# Patient Record
Sex: Male | Born: 1980
Health system: Southern US, Community
[De-identification: ages and names within clinical notes are randomized; demographics above are authoritative.]

## PROBLEM LIST (undated history)

## (undated) DIAGNOSIS — I10 Essential (primary) hypertension: Secondary | ICD-10-CM

## (undated) DIAGNOSIS — K219 Gastro-esophageal reflux disease without esophagitis: Secondary | ICD-10-CM

---
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
## 2005-03-05 ENCOUNTER — Ambulatory Visit: Payer: Self-pay | Admitting: Internal Medicine

## 2005-04-02 ENCOUNTER — Ambulatory Visit: Payer: Self-pay | Admitting: Internal Medicine

## 2005-04-06 ENCOUNTER — Ambulatory Visit: Payer: Self-pay | Admitting: *Deleted

## 2006-08-19 ENCOUNTER — Emergency Department (HOSPITAL_COMMUNITY): Admission: EM | Admit: 2006-08-19 | Discharge: 2006-08-19 | Payer: Self-pay | Admitting: Family Medicine

## 2006-08-19 LAB — CONVERTED CEMR LAB
BUN: 4 mg/dL
Chloride: 108 meq/L
Glucose, Bld: 98 mg/dL
HCT: 44 %
Hemoglobin: 15 g/dL

## 2006-12-30 ENCOUNTER — Telehealth (INDEPENDENT_AMBULATORY_CARE_PROVIDER_SITE_OTHER): Payer: Self-pay | Admitting: *Deleted

## 2007-01-02 ENCOUNTER — Ambulatory Visit: Payer: Self-pay | Admitting: Family Medicine

## 2007-01-02 DIAGNOSIS — F17200 Nicotine dependence, unspecified, uncomplicated: Secondary | ICD-10-CM | POA: Insufficient documentation

## 2007-01-02 DIAGNOSIS — I10 Essential (primary) hypertension: Secondary | ICD-10-CM

## 2007-01-02 DIAGNOSIS — K279 Peptic ulcer, site unspecified, unspecified as acute or chronic, without hemorrhage or perforation: Secondary | ICD-10-CM | POA: Insufficient documentation

## 2007-01-10 ENCOUNTER — Telehealth (INDEPENDENT_AMBULATORY_CARE_PROVIDER_SITE_OTHER): Payer: Self-pay | Admitting: *Deleted

## 2007-01-11 ENCOUNTER — Encounter (INDEPENDENT_AMBULATORY_CARE_PROVIDER_SITE_OTHER): Payer: Self-pay | Admitting: *Deleted

## 2007-01-12 ENCOUNTER — Encounter (INDEPENDENT_AMBULATORY_CARE_PROVIDER_SITE_OTHER): Payer: Self-pay | Admitting: *Deleted

## 2007-01-12 ENCOUNTER — Ambulatory Visit: Payer: Self-pay | Admitting: Internal Medicine

## 2007-01-12 ENCOUNTER — Encounter (INDEPENDENT_AMBULATORY_CARE_PROVIDER_SITE_OTHER): Payer: Self-pay | Admitting: Nurse Practitioner

## 2007-01-16 ENCOUNTER — Encounter (INDEPENDENT_AMBULATORY_CARE_PROVIDER_SITE_OTHER): Payer: Self-pay | Admitting: Nurse Practitioner

## 2007-01-16 LAB — CONVERTED CEMR LAB
BUN: 9 mg/dL (ref 6–23)
Basophils Relative: 0 % (ref 0–1)
Chloride: 107 meq/L (ref 96–112)
Eosinophils Absolute: 0.1 10*3/uL (ref 0.0–0.7)
HCT: 40.2 % (ref 39.0–52.0)
Hemoglobin: 13.2 g/dL (ref 13.0–17.0)
Lymphocytes Relative: 40 % (ref 12–46)
Monocytes Relative: 6 % (ref 3–11)
Neutro Abs: 4.4 10*3/uL (ref 1.7–7.7)
RBC: 4.44 M/uL (ref 4.22–5.81)
Sodium: 142 meq/L (ref 135–145)

## 2007-01-17 ENCOUNTER — Telehealth (INDEPENDENT_AMBULATORY_CARE_PROVIDER_SITE_OTHER): Payer: Self-pay | Admitting: *Deleted

## 2007-01-25 ENCOUNTER — Encounter (INDEPENDENT_AMBULATORY_CARE_PROVIDER_SITE_OTHER): Payer: Self-pay | Admitting: Nurse Practitioner

## 2007-02-01 ENCOUNTER — Ambulatory Visit (HOSPITAL_COMMUNITY): Admission: RE | Admit: 2007-02-01 | Discharge: 2007-02-01 | Payer: Self-pay | Admitting: Gastroenterology

## 2007-02-01 ENCOUNTER — Encounter (INDEPENDENT_AMBULATORY_CARE_PROVIDER_SITE_OTHER): Payer: Self-pay | Admitting: Nurse Practitioner

## 2007-02-08 ENCOUNTER — Encounter (INDEPENDENT_AMBULATORY_CARE_PROVIDER_SITE_OTHER): Payer: Self-pay | Admitting: Nurse Practitioner

## 2007-05-26 ENCOUNTER — Ambulatory Visit: Payer: Self-pay | Admitting: Nurse Practitioner

## 2007-05-26 LAB — CONVERTED CEMR LAB
AST: 21 units/L (ref 0–37)
Albumin: 5 g/dL (ref 3.5–5.2)
Alkaline Phosphatase: 73 units/L (ref 39–117)
BUN: 11 mg/dL (ref 6–23)
Calcium: 9.7 mg/dL (ref 8.4–10.5)
Eosinophils Relative: 0 % (ref 0–5)
HCT: 42.8 % (ref 39.0–52.0)
Hemoglobin: 14.5 g/dL (ref 13.0–17.0)
Lymphocytes Relative: 32 % (ref 12–46)
MCHC: 33.9 g/dL (ref 30.0–36.0)
MCV: 89.4 fL (ref 78.0–100.0)
Neutro Abs: 5.5 10*3/uL (ref 1.7–7.7)
Neutrophils Relative %: 61 % (ref 43–77)
RBC: 4.79 M/uL (ref 4.22–5.81)
RDW: 13.9 % (ref 11.5–15.5)
Total Bilirubin: 0.4 mg/dL (ref 0.3–1.2)

## 2007-05-29 ENCOUNTER — Encounter (INDEPENDENT_AMBULATORY_CARE_PROVIDER_SITE_OTHER): Payer: Self-pay | Admitting: Nurse Practitioner

## 2007-08-21 ENCOUNTER — Telehealth (INDEPENDENT_AMBULATORY_CARE_PROVIDER_SITE_OTHER): Payer: Self-pay | Admitting: Nurse Practitioner

## 2007-10-02 ENCOUNTER — Ambulatory Visit: Payer: Self-pay | Admitting: Nurse Practitioner

## 2007-10-02 DIAGNOSIS — K2970 Gastritis, unspecified, without bleeding: Secondary | ICD-10-CM | POA: Insufficient documentation

## 2007-10-02 DIAGNOSIS — K2990 Gastroduodenitis, unspecified, without bleeding: Secondary | ICD-10-CM

## 2008-03-01 ENCOUNTER — Ambulatory Visit: Payer: Self-pay | Admitting: Nurse Practitioner

## 2008-03-01 DIAGNOSIS — M2140 Flat foot [pes planus] (acquired), unspecified foot: Secondary | ICD-10-CM | POA: Insufficient documentation

## 2008-07-30 ENCOUNTER — Emergency Department (HOSPITAL_COMMUNITY): Admission: EM | Admit: 2008-07-30 | Discharge: 2008-07-30 | Payer: Self-pay | Admitting: Emergency Medicine

## 2009-04-29 ENCOUNTER — Ambulatory Visit: Payer: Self-pay | Admitting: Nurse Practitioner

## 2009-04-29 LAB — CONVERTED CEMR LAB: Rapid HIV Screen: NEGATIVE

## 2009-05-01 ENCOUNTER — Encounter (INDEPENDENT_AMBULATORY_CARE_PROVIDER_SITE_OTHER): Payer: Self-pay | Admitting: Nurse Practitioner

## 2009-05-01 DIAGNOSIS — E7800 Pure hypercholesterolemia, unspecified: Secondary | ICD-10-CM

## 2009-05-01 LAB — CONVERTED CEMR LAB
Albumin: 4.9 g/dL (ref 3.5–5.2)
Basophils Absolute: 0 10*3/uL (ref 0.0–0.1)
Basophils Relative: 0 % (ref 0–1)
CO2: 24 meq/L (ref 19–32)
Calcium: 9.4 mg/dL (ref 8.4–10.5)
Cholesterol: 215 mg/dL — ABNORMAL HIGH (ref 0–200)
Eosinophils Absolute: 0 10*3/uL (ref 0.0–0.7)
Lymphs Abs: 2.9 10*3/uL (ref 0.7–4.0)
MCHC: 33 g/dL (ref 30.0–36.0)
Potassium: 4.2 meq/L (ref 3.5–5.3)
Sodium: 139 meq/L (ref 135–145)
TSH: 1.027 microintl units/mL (ref 0.350–4.500)
Total Bilirubin: 0.3 mg/dL (ref 0.3–1.2)
WBC: 9.9 10*3/uL (ref 4.0–10.5)

## 2009-09-01 ENCOUNTER — Emergency Department (HOSPITAL_COMMUNITY): Admission: EM | Admit: 2009-09-01 | Discharge: 2009-09-01 | Payer: Self-pay | Admitting: Emergency Medicine

## 2009-10-13 ENCOUNTER — Emergency Department (HOSPITAL_COMMUNITY): Admission: EM | Admit: 2009-10-13 | Discharge: 2009-10-13 | Payer: Self-pay | Admitting: Emergency Medicine

## 2009-10-17 ENCOUNTER — Telehealth (INDEPENDENT_AMBULATORY_CARE_PROVIDER_SITE_OTHER): Payer: Self-pay | Admitting: Nurse Practitioner

## 2010-03-18 ENCOUNTER — Telehealth (INDEPENDENT_AMBULATORY_CARE_PROVIDER_SITE_OTHER): Payer: Self-pay | Admitting: Nurse Practitioner

## 2010-04-24 ENCOUNTER — Ambulatory Visit: Payer: Self-pay | Admitting: Rheumatology

## 2010-05-26 NOTE — Progress Notes (Signed)
Summary: Pt injured his shoulder   Phone Note Call from Patient Call back at [phone]   Summary of Call: This past Sunday,  the pt above was wrestling, and he hurt his shoulder and as result, on June 20th   he went to the ED and they gave him ibuprofen and vicodin. The two medications cause him stomach problems.  Also he needs inflammatory medication because his shoulder is inflames badly. The ED did an x-ray on him and there is not a fracture but the pt is under a lot of pain. [facility] FNP   Initial call taken by: Manon Hilding,  October 17, 2009 12:13 PM  Follow-up for Phone Call        E-charts report is on your desk for review. Follow-up by: Levon Hedger,  October 17, 2009 12:38 PM  Additional Follow-up for Phone Call Additional follow up Details #1::        Shoulder strain according to notes from ED.  Xray neg. He can increase his nexium to two times a day. He should take ibuprofen with food. He can take Aleve instead of ibuprofen (2 tab two times a day) with a meal as needed for pain. He can take tylenol 500 mg 1-2 tabs every 6 hours as needed for pain. Do not take Tylenol with the Norco as it has tylenol in it. Schedule appt if no better. Additional Follow-up by: Tereso Newcomer PA-C,  October 17, 2009 2:46 PM    Additional Follow-up for Phone Call Additional follow up Details #2::    pt informed. Follow-up by: Levon Hedger,  October 17, 2009 4:10 PM   Appended Document: Pt injured his shoulder reviewed ER note and agree with plan as ordered by Wende Mott

## 2010-05-26 NOTE — Progress Notes (Signed)
Summary: NEED TO SPEAK WITH THE NURSE  Phone Note Call from Patient   Summary of Call: PLEASE CALL PATIENT THE ASK FOR APP FOR TODAY HE IS HAVING STOMACH PAIN PHONE [phone] Initial call taken by: Domenic Polite,  March 18, 2010 10:34 AM  Follow-up for Phone Call        complains of constant pain lower Abd, occasionally sharp other times it's burning sensation. Has been taking Carafate and Nexium as ordered. Last 2 mornings has vomited. Has appt. 04/24/10 no earlier appts. available. Requesting to be seen or ordered some other medication. Follow-up by: Gaylyn Cheers RN,  March 18, 2010 12:38 PM  Additional Follow-up for Phone Call Additional follow up Details #1::        Pt has had this in the past - seems to reoccur around holiday time. reinforce that pt needs to avoid alcohol use. he should eat small meals. avoid fried spicy foods, also tomato based foods He should increase nexium to two times a day x 1 weeks then resume daily before breakfast. carafate should be three times a day before meals. No refills in several months - what pharmacy does pt use so i can send refills there. doubt he has been compliant with meds given lack of refills during year Additional Follow-up by: Lehman Prom FNP,  March 18, 2010 12:49 PM    Additional Follow-up for Phone Call Additional follow up Details #2::    Pt. given info. Gets meds @ [facility] however he said he just got his card renewed. Checked with [facility] he has all his meds waiting for pick-up. Called pt. again to let him know he needs to pick up meds. Follow-up by: Gaylyn Cheers RN,  March 18, 2010 1:39 PM  Additional Follow-up for Phone Call Additional follow up Details #3:: Details for Additional Follow-up Action Taken: Thanks Additional Follow-up by: Lehman Prom FNP,  March 18, 2010 3:13 PM

## 2010-05-26 NOTE — Assessment & Plan Note (Signed)
Summary: Gastritis/HTN   Vital Signs:  Patient profile:   30 year old male Weight:      221.5 pounds BMI:     31.67 BSA:     2.19 Temp:     97.8 degrees F oral Pulse rate:   90 / minute Pulse rhythm:   regular Resp:     20 per minute BP sitting:   132 / 80  (left arm) Cuff size:   large  Vitals Entered By: Levon Hedger (April 29, 2009 12:49 PM) CC: stomach issue since last wednesday having some burning, Hypertension Management Is Patient Diabetic? No Pain Assessment Patient in pain? yes     Location: stomach Intensity: 7  Does patient need assistance? Functional Status Self care Ambulation Normal   CC:  stomach issue since last wednesday having some burning and Hypertension Management.  History of Present Illness:  Pt into the office for follow up for stomach problems Pt was doing well until last Wednesday. Vomiting x 1 with coffee ground emesis admits that over the holidays he drank some ETOH and ate some fried foods both of which he had taken out of his diet about 1 year ago.  Girlfriend presents today with pt  Hypertension History:      He denies headache, chest pain, and palpitations.  He notes no problems with any antihypertensive medication side effects.        Positive major cardiovascular risk factors include hypertension and current tobacco user.  Negative major cardiovascular risk factors include male age less than 50 years old, no history of diabetes, and negative family history for ischemic heart disease.        Further assessment for target organ damage reveals no history of ASHD, cardiac end-organ damage (CHF/LVH), stroke/TIA, peripheral vascular disease, renal insufficiency, or hypertensive retinopathy.     Allergies (verified): No Known Drug Allergies  Review of Systems General:  Denies fever. CV:  Denies chest pain or discomfort. Resp:  Denies cough. GI:  Complains of abdominal pain, nausea, and vomiting.  Physical Exam  General:   alert.   Head:  normocephalic.   Lungs:  normal breath sounds.   Heart:  normal rate and regular rhythm.   Abdomen:  mid epigastric pain Neurologic:  alert & oriented X3.     Impression & Recommendations:  Problem # 1:  GASTRITIS (ICD-535.50) restart bland diet and meds advise pt if he sees any bright red blood with emesis this should be considered an emergency His updated medication list for this problem includes:    Carafate 1 Gm Tabs (Sucralfate) .Marland Kitchen... 1 tablet by mouth three times a day for stomach    Nexium 40 Mg Cpdr (Esomeprazole magnesium) .Marland Kitchen... 1 tablet by mouth two times a day for 1 week then one tablet by mouth daily  for stomach  Problem # 2:  HYPERTENSION, BENIGN ESSENTIAL (ICD-401.1) stable DASH diet His updated medication list for this problem includes:    Norvasc 5 Mg Tabs (Amlodipine besylate) .Marland Kitchen... 1 tablet by mouth daily for blood pressure    Diltiazem Hcl Cr 240 Mg Cp24 (Diltiazem hcl) .Marland Kitchen... 1 tablet by mouth daily for blood pressure  Orders: T-Lipid Profile ([id number]) T-Comprehensive Metabolic Panel [phone]) T-CBC w/Diff (52841-32440) Rapid HIV  (10272) T-TSH (53664-40347)  Problem # 3:  TOBACCO ABUSE (ICD-305.1)  Problem # 4:  PES PLANUS (ICD-734)  Complete Medication List: 1)  Carafate 1 Gm Tabs (Sucralfate) .Marland Kitchen.. 1 tablet by mouth three times a day for stomach 2)  Norvasc 5 Mg Tabs (Amlodipine besylate) .Marland Kitchen.. 1 tablet by mouth daily for blood pressure 3)  Diltiazem Hcl Cr 240 Mg Cp24 (Diltiazem hcl) .Marland Kitchen.. 1 tablet by mouth daily for blood pressure 4)  Nexium 40 Mg Cpdr (Esomeprazole magnesium) .Marland Kitchen.. 1 tablet by mouth two times a day for 1 week then one tablet by mouth daily  for stomach  Hypertension Assessment/Plan:      The patient's hypertensive risk group is category B: At least one risk factor (excluding diabetes) with no target organ damage.  Today's blood pressure is 132/80.  His blood pressure goal is < 140/90.  Patient Instructions: 1)   You will need to avoid fried, spicy foods. 2)  Your medications will sent to the pharmacy. 3)  Follow up as needed. Prescriptions: NEXIUM 40 MG CPDR (ESOMEPRAZOLE MAGNESIUM) 1 tablet by mouth two times a day for 1 week then one tablet by mouth daily  for stomach  #42 x 5   Entered and Authorized by:   Lehman Prom FNP   Signed by:   Lehman Prom FNP on 04/29/2009   Method used:   Faxed to ...       [facility] (retail)       [address] [place], [place]  [address]       Ph: [phone]       Fax: [phone]   RxID:   [id number] DILTIAZEM HCL CR 240 MG  CP24 (DILTIAZEM HCL) 1 tablet by mouth daily for blood pressure  #30 x 5   Entered and Authorized by:   Lehman Prom FNP   Signed by:   Lehman Prom FNP on 04/29/2009   Method used:   Faxed to ...       [facility] - [facility] (retail)       [address] [place], [place]  [address]       Ph: [phone]       Fax: [phone]   RxID:   [id number] NORVASC 5 MG  TABS (AMLODIPINE BESYLATE) 1 tablet by mouth daily for blood pressure  #30 x 5   Entered and Authorized by:   Lehman Prom FNP   Signed by:   Lehman Prom FNP on 04/29/2009   Method used:   Faxed to ...       [facility] - [facility] (retail)       [address] [place], [place]  [address]       Ph: [phone]       Fax: [phone]   RxID:   [id number] CARAFATE 1 GM  TABS (SUCRALFATE) 1 tablet by mouth three times a day for stomach  #90 x 5   Entered and Authorized by:   Lehman Prom FNP   Signed by:   Lehman Prom FNP on 04/29/2009   Method used:   Faxed to ...       [facility] - [facility] (retail)       [address] [place], [place]  [address]       Ph: [phone]       Fax: [phone]   RxID:   [id number]   Laboratory Results  Date/Time Received: April 29, 2009 1:59 PM  Date/Time Reported: April 29, 2009 1:59 PM   Other Tests  Rapid HIV: negative

## 2010-05-26 NOTE — Letter (Signed)
Summary: Lipid Letter  [facility]  [address] [place], [place] [address]   Phone: [phone]  Fax: [phone]    05/01/2009  Johnathan Roy [address] [place], [place]  [address]  Dear Johnathan Roy:  We have carefully reviewed your last lipid profile from 04/29/2009 and the results are noted below with a summary of recommendations for lipid management.    Cholesterol:       215     Goal: less than 200   HDL "good" Cholesterol:   69     Goal: greater than 40   LDL "bad" Cholesterol:   125     Goal: less than 130   Triglycerides:       103     Goal: less than 150    Your cholesterol was a little high during your last visit.  There is no need for medications, just try to continue to avoid fried fatty foods.     Current Medications: 1)    Carafate 1 Gm  Tabs (Sucralfate) .Marland Kitchen.. 1 tablet by mouth three times a day for stomach 2)    Norvasc 5 Mg  Tabs (Amlodipine besylate) .Marland Kitchen.. 1 tablet by mouth daily for blood pressure 3)    Diltiazem Hcl Cr 240 Mg  Cp24 (Diltiazem hcl) .Marland Kitchen.. 1 tablet by mouth daily for blood pressure 4)    Nexium 40 Mg Cpdr (Esomeprazole magnesium) .Marland Kitchen.. 1 tablet by mouth two times a day for 1 week then one tablet by mouth daily  for stomach  If you have any questions, please call. We appreciate being able to work with you.   Sincerely,    [facility] Lehman Prom FNP

## 2010-08-05 LAB — COMPREHENSIVE METABOLIC PANEL
Albumin: 4.1 g/dL (ref 3.5–5.2)
Alkaline Phosphatase: 64 U/L (ref 39–117)
BUN: 14 mg/dL (ref 6–23)
Creatinine, Ser: 0.94 mg/dL (ref 0.4–1.5)
GFR calc non Af Amer: >60 mL/min (ref >60)
Potassium: 3.7 mEq/L (ref 3.5–5.1)
Total Bilirubin: 0.7 mg/dL (ref 0.3–1.2)
Total Protein: 7 g/dL (ref 6.0–8.3)

## 2010-08-05 LAB — URINALYSIS, ROUTINE W REFLEX MICROSCOPIC
Bilirubin Urine: NEGATIVE
Leukocytes, UA: NEGATIVE
Nitrite: NEGATIVE
Protein, ur: NEGATIVE mg/dL
Urobilinogen, UA: 1 mg/dL (ref 0.0–1.0)

## 2010-08-05 LAB — DIFFERENTIAL
Eosinophils Absolute: 0.1 10*3/uL (ref 0.0–0.7)
Eosinophils Relative: 1 % (ref 0–5)
Lymphs Abs: 4.2 10*3/uL — ABNORMAL HIGH (ref 0.7–4.0)

## 2010-08-05 LAB — CBC
Hemoglobin: 13.9 g/dL (ref 13.0–17.0)
RBC: 4.43 MIL/uL (ref 4.22–5.81)
RDW: 14.4 % (ref 11.5–15.5)
WBC: 11.1 10*3/uL — ABNORMAL HIGH (ref 4.0–10.5)

## 2010-08-05 LAB — LIPASE, BLOOD: Lipase: 21 U/L (ref 11–59)

## 2010-09-08 NOTE — Op Note (Signed)
NAMEAUDEL, COAKLEY             ACCOUNT NO.:  [id number]   MEDICAL RECORD NO.:  [id number]          PATIENT TYPE:  AMB   LOCATION:  ENDO                         FACILITY:  [facility]   PHYSICIAN:  Graylin Shiver, M.D.   DATE OF BIRTH:  [birth year]   DATE OF PROCEDURE:  02/01/2007  DATE OF DISCHARGE:                               OPERATIVE REPORT   PROCEDURE:  Esophagogastroduodenoscopy.   INDICATIONS FOR PROCEDURE:  The patient is a 30 year old male who  complains of epigastric pain, vomiting and hematemesis.   Informed consent was obtained after explanation of the risks of  bleeding, infection and perforation.   PREMEDICATION:  Fentanyl 75 mcg IV, Versed 5 mg IV.   PROCEDURE:  With the patient in the left lateral decubitus position, the  Pentax gastroscope was inserted into the oropharynx and passed into the  esophagus.  It was advanced down the esophagus then into the stomach and  into the duodenum.  The second portion and bulb of the duodenum looked  normal.  The stomach looked normal in its entirety.  The esophagus  looked normal in its entirety.  He tolerated the procedure well without  complications.   IMPRESSION:  Normal esophagogastroduodenoscopy.   There is nothing on this exam to explain epigastric pain, vomiting or  hematemesis           ______________________________  Graylin Shiver, M.D.     SFG/MEDQ  D:  02/01/2007  T:  02/02/2007  Job:  [id number]   cc:   Benetta Spar R. Rankins, M.D.  Fax: [phone]

## 2011-12-11 ENCOUNTER — Emergency Department (HOSPITAL_COMMUNITY)
Admission: EM | Admit: 2011-12-11 | Discharge: 2011-12-11 | Disposition: A | Discharge status: Home / Self Care | Payer: No Typology Code available for payment source | Attending: Emergency Medicine | Admitting: Emergency Medicine

## 2011-12-11 ENCOUNTER — Encounter (HOSPITAL_COMMUNITY): Payer: Self-pay | Admitting: Emergency Medicine

## 2011-12-11 DIAGNOSIS — M546 Pain in thoracic spine: Secondary | ICD-10-CM | POA: Insufficient documentation

## 2011-12-11 DIAGNOSIS — M549 Dorsalgia, unspecified: Secondary | ICD-10-CM

## 2011-12-11 DIAGNOSIS — I10 Essential (primary) hypertension: Secondary | ICD-10-CM | POA: Insufficient documentation

## 2011-12-11 DIAGNOSIS — Y92410 Unspecified street and highway as the place of occurrence of the external cause: Secondary | ICD-10-CM | POA: Insufficient documentation

## 2011-12-11 HISTORY — DX: Essential (primary) hypertension: I10

## 2011-12-11 MED ORDER — HYDROCODONE-ACETAMINOPHEN 5-325 MG PO TABS
1.0000 | ORAL_TABLET | Freq: Once | ORAL | Status: AC
  Administered 2011-12-11: 1 via ORAL
  Filled 2011-12-11: qty 1

## 2011-12-11 MED ORDER — CYCLOBENZAPRINE HCL 10 MG PO TABS: 10.0000 mg | ORAL_TABLET | Freq: Three times a day (TID) | ORAL | Status: AC | PRN

## 2011-12-11 MED ORDER — DIAZEPAM 5 MG PO TABS
5.0000 mg | ORAL_TABLET | Freq: Once | ORAL | Status: AC
  Administered 2011-12-11: 5 mg via ORAL
  Filled 2011-12-11: qty 1

## 2011-12-11 MED ORDER — HYDROCODONE-ACETAMINOPHEN 5-325 MG PO TABS: 1.0000 | ORAL_TABLET | Freq: Four times a day (QID) | ORAL | Status: AC | PRN
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
## 2011-12-11 NOTE — ED Provider Notes (Signed)
Medical screening examination/treatment/procedure(s) were performed by non-physician practitioner and as supervising physician I was immediately available for consultation/collaboration.   Gavin Pound. Darbie Biancardi, MD 12/11/11 2310
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
## 2011-12-20 ENCOUNTER — Encounter: Payer: Self-pay | Admitting: Family Medicine

## 2011-12-20 ENCOUNTER — Ambulatory Visit (INDEPENDENT_AMBULATORY_CARE_PROVIDER_SITE_OTHER): Payer: Self-pay | Admitting: Family Medicine

## 2011-12-20 VITALS — BP 123/84 | HR 87 | Temp 98.4°F | Ht 71.0 in | Wt 227.0 lb

## 2011-12-20 DIAGNOSIS — F17200 Nicotine dependence, unspecified, uncomplicated: Secondary | ICD-10-CM

## 2011-12-20 DIAGNOSIS — K2990 Gastroduodenitis, unspecified, without bleeding: Secondary | ICD-10-CM

## 2011-12-20 DIAGNOSIS — M549 Dorsalgia, unspecified: Secondary | ICD-10-CM

## 2011-12-20 DIAGNOSIS — I10 Essential (primary) hypertension: Secondary | ICD-10-CM

## 2011-12-20 DIAGNOSIS — K2970 Gastritis, unspecified, without bleeding: Secondary | ICD-10-CM

## 2011-12-20 MED ORDER — CHLORTHALIDONE 25 MG PO TABS: 25.0000 mg | ORAL_TABLET | Freq: Every day | ORAL | Status: DC

## 2011-12-20 MED ORDER — ESOMEPRAZOLE MAGNESIUM 40 MG PO CPDR: 40.0000 mg | DELAYED_RELEASE_CAPSULE | Freq: Every day | ORAL | Status: DC

## 2011-12-20 NOTE — Patient Instructions (Addendum)
The new blood pressure medicine is called Chlorthalidone.   Take this once a day.    Refill for the Nexium as well.    Come back in 2 weeks for a nurse visit to recheck your blood pressure.

## 2011-12-22 DIAGNOSIS — M549 Dorsalgia, unspecified: Secondary | ICD-10-CM | POA: Insufficient documentation
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
## 2011-12-22 NOTE — Assessment & Plan Note (Signed)
No red flags based on history or physical today Refill for Nexium.
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
## 2011-12-22 NOTE — Assessment & Plan Note (Signed)
At goal today. He was previously taking amlodipine and diltiazem provided by previous PCP. Switch to chlorthalidone today. He may actually do well off of blood pressure medications as he has been out of his blood pressure medication for about 3 months and is still normotensive today. Follow up in 2 weeks for blood pressure recheck.

## 2011-12-22 NOTE — Assessment & Plan Note (Signed)
Counseled to quit completely 

## 2012-10-26 ENCOUNTER — Encounter: Payer: Self-pay | Admitting: Sports Medicine

## 2012-10-26 ENCOUNTER — Ambulatory Visit (INDEPENDENT_AMBULATORY_CARE_PROVIDER_SITE_OTHER): Payer: Self-pay | Admitting: Sports Medicine

## 2012-10-26 VITALS — BP 125/70 | HR 95 | Temp 98.7°F | Ht 71.0 in | Wt 218.0 lb

## 2012-10-26 DIAGNOSIS — K2990 Gastroduodenitis, unspecified, without bleeding: Secondary | ICD-10-CM

## 2012-10-26 DIAGNOSIS — K2970 Gastritis, unspecified, without bleeding: Secondary | ICD-10-CM

## 2012-10-26 MED ORDER — ESOMEPRAZOLE MAGNESIUM 40 MG PO CPDR: 40.0000 mg | DELAYED_RELEASE_CAPSULE | Freq: Every day | ORAL | Status: DC

## 2012-10-26 MED ORDER — ONDANSETRON HCL 4 MG PO TABS: 4.0000 mg | ORAL_TABLET | Freq: Three times a day (TID) | ORAL | Status: DC | PRN

## 2012-10-26 NOTE — Patient Instructions (Addendum)
It was nice to see you today.   Today we discussed: 1. GASTRITIS Try - esomeprazole (NEXIUM) 40 MG capsule; Take 1 capsule (40 mg total) by mouth daily.  Dispense: 30 capsule; Refill: 3 - ondansetron (ZOFRAN) 4 MG tablet; Take 1 tablet (4 mg total) by mouth every 8 (eight) hours as needed for nausea.  Dispense: 20 tablet; Refill: 0   Please plan to return to see Dr. Gwendolyn Grant in 3 months.  If you need anything prior to seeing me please call the clinic.  Please Bring all medications with you to each appointment.  STOP SMOKING  Cyclic Vomiting Syndrome Cyclic vomiting syndrome is a benign condition in which patients experience bouts or cycles of severe nausea and vomiting that last for hours or even days. The bouts of nausea and vomiting alternate with longer periods of no symptoms and generally good health. Cyclic vomiting syndrome occurs mostly in children, but can affect adults. CAUSES  [facility] has no known cause. Each episode is typically similar to the previous ones. The episodes tend to:   Start at about the same time of day.  Last the same length of time.  Present the same symptoms at the same level of intensity. Cyclic vomiting syndrome can begin at any age in children and adults. Cyclic vomiting syndrome usually starts between the ages of 3 and 7 years. In adults, episodes tend to occur less often than they do in children, but they last longer. Furthermore, the events or situations that trigger episodes in adults cannot always be pinpointed as easily as they can in children. There are 4 phases of cyclic vomiting syndrome: 1. Prodrome. The prodrome phase signals that an episode of nausea and vomiting is about to begin. This phase can last from just a few minutes to several hours. This phase is often marked by belly (abdominal) pain. Sometimes taking medicine early in the prodrome phase can stop an episode in progress. However, sometimes there is no warning. A person may simply wake up in  the middle of the night or early morning and begin vomiting. 2. Episode. The episode phase consists of:  Severe vomiting.  Nausea.  Gagging (retching). 3. Recovery. The recovery phase begins when the nausea and vomiting stop. Healthy color, appetite, and energy return. 4. Symptom-free interval. The symptom-free interval phase is the period between episodes when no symptoms are present. TRIGGERS Episodes can be triggered by an infection or event. Examples of triggers include:  Infections.  Colds, allergies, sinus problems, and the flu.  Eating certain foods such as chocolate or cheese.  Foods with monosodium glutamate (MSG) or preservatives.  Fast foods.  Pre-packaged foods.  Foods with low nutritional value (junk foods).  Overeating.  Eating just before going to bed.  Hot weather.  Dehydration.  Not enough sleep or poor sleep quality.  Physical exhaustion.  Menstruation.  Motion sickness.  Emotional stress (school or home difficulties).  Excitement or stress. SYMPTOMS  The main symptoms of cyclic vomiting syndrome are:  Severe vomiting.  Nausea.  Gagging (retching). Episodes usually begin at night or the first thing in the morning. Episodes may include vomiting or retching up to 5 or 6 times an hour during the worst of the episode. Episodes usually last anywhere from 1 to 4 days. Episodes can last for up to 10 days. Other symptoms include:  Paleness.  Exhaustion.  Listlessness.  Abdominal pain.  Loose stools or diarrhea. Sometimes the nausea and vomiting are so severe that a person appears to  be almost unconscious. Sensitivity to light, headache, fever, dizziness, may also accompany an episode. In addition, the vomiting may cause drooling and excessive thirst. Drinking water usually leads to more vomiting, though the water can dilute the acid in the vomit, making the episode a little less painful. Continuous vomiting can lead to dehydration, which  means that the body has lost excessive water and salts. DIAGNOSIS  Cyclic vomiting syndrome is hard to diagnose because there are no clear tests to identify it. A caregiver must diagnose cyclic vomiting syndrome by looking at symptoms and medical history. A caregiver must exclude more common diseases or disorders that can also cause nausea and vomiting. Also, diagnosis takes time because caregivers need to identify a pattern or cycle to the vomiting. TREATMENT  Cyclic vomiting syndrome cannot be cured. Treatment varies, but people with cyclic vomiting syndrome should get plenty of rest and sleep and take medications that prevent, stop, or lessen the vomiting episodes and other symptoms. People whose episodes are frequent and long-lasting may be treated during the symptom-free intervals in an effort to prevent or ease future episodes. The symptom-free phase is a good time to eliminate anything known to trigger an episode. For example, if episodes are brought on by stress or excitement, this period is the time to find ways to reduce stress and stay calm. If sinus problems or allergies cause episodes, those conditions should be treated. The triggers listed above should be avoided or prevented. Because of the similarities between migraine and cyclic vomiting syndrome, caregivers treat some people with severe cyclic vomiting syndrome with drugs that are also used for migraine headaches. The drugs are designed to:  Prevent episodes.  Reduce their frequency.  Lessen their severity. HOME CARE INSTRUCTIONS Once a vomiting episode begins, treatment is supportive. It helps to stay in bed and sleep in a dark, quiet room. Severe nausea and vomiting may require hospitalization and intravenous (IV) fluids to prevent dehydration. Relaxing medications (sedatives) may help if the nausea continues. Sometimes, during the prodrome phase, it is possible to stop an episode from happening altogether. Only take  over-the-counter or prescription medicines for pain, discomfort or fever as directed by your caregiver. Do not give aspirin to children. During the recovery phase, drinking water and replacing lost electrolytes (salts in the blood) are very important. Electrolytes are salts that the body needs to function well and stay healthy. Symptoms during the recovery phase can vary. Some people find that their appetites return to normal immediately, while others need to begin by drinking clear liquids and then move slowly to solid food. RELATED COMPLICATIONS The severe vomiting that defines cyclic vomiting syndrome is a risk factor for several complications:  Dehydration Vomiting causes the body to lose water quickly.  Electrolyte imbalance Vomiting also causes the body to lose the important salts it needs to keep working properly.  Peptic esophagitis The tube that connects the mouth to the stomach (esophagus) becomes injured from the stomach acid that comes up with the vomit.  Hematemesis The esophagus becomes irritated and bleeds, so blood mixes with the vomit.  Mallory-Weiss tear The lower end of the esophagus may tear open or the stomach may bruise from vomiting or retching.  Tooth decay The acid in the vomit can hurt the teeth by corroding the tooth enamel. SEEK MEDICAL CARE IF: You have questions or problems. Document Released: 06/21/2001 Document Revised: 07/05/2011 Document Reviewed: 07/20/2010 [facility] Patient Information 2014 [place], [place].

## 2012-10-26 NOTE — Assessment & Plan Note (Addendum)
Some blood streaked emesis Chronic cyclic in nature.  Worsened since off Nexium Refill Nexium, Rx for Zofran Consider Cyclic Vomiting - associated with THC -precautions for ED if needed > f/u with Dr. Gwendolyn Grant in 3 months

## 2012-10-26 NOTE — Progress Notes (Signed)
  [facility]  Patient name: Denman Pichardo MRN [id number]  Date of birth: 13-Aug-1980  CC & HPI:  Manas Hickling is a 32 y.o. male presenting today for stomach burning and blood streaked emesis:  Has long standing issues with recurrent vomiting.  Has had burning into stomach.  Has had 2-3 episodes of blood-streaked emesis.  No gross hematemesis.  Has not been taking any medications for this.  As previously taking Nexium that seemed to help.  Reports that his vomiting seems to be associated with marijuana use.  ROS:  No weight loss, no chest pain, no shortness of breath, no dizziness, lightheadedness, no orthostasis.  Pertinent History Reviewed:  Medical & Surgical Hx:  Reviewed: Significant for hypertension, PUD, no prior EGD Medications: Reviewed & Updated - see associated section Social History: Reviewed -  reports that he has never smoked. He has never used smokeless tobacco. heavy Marijuana use  Objective Findings:  Vitals: BP 125/70  Pulse 95  Temp(Src) 98.7 F (37.1 C) (Oral)  Ht 5\' 11"  (1.803 m)  Wt 218 lb (98.884 kg)  BMI 30.42 kg/m2  PE: GENERAL:  Adult AA  male. In no discomfort; no respiratory distress. PSYCH: Alert and appropriately interactive; Insight:Good   H&N: AT/[place], trachea midline EENT:  MMM, no scleral icterus, EOMi HEART: RRR, S1/S2 heard, no murmur LUNGS: CTA B, no wheezes, no crackles Abdomen: +BS, soft, non-tender, no rigidity, no guarding, no masses/organomegaly EXTREMITIES: Moves all 4 extremities spontaneously, warm well perfused, no edema, bilateral DP and PT pulses 2/4.      Assessment & Plan:

## 2013-10-04 ENCOUNTER — Emergency Department (HOSPITAL_COMMUNITY): Payer: Self-pay

## 2013-10-04 ENCOUNTER — Encounter (HOSPITAL_COMMUNITY): Payer: Self-pay | Admitting: Emergency Medicine

## 2013-10-04 ENCOUNTER — Emergency Department (HOSPITAL_COMMUNITY)
Admission: EM | Admit: 2013-10-04 | Discharge: 2013-10-04 | Disposition: A | Discharge status: Home / Self Care | Payer: Self-pay | Attending: Emergency Medicine | Admitting: Emergency Medicine

## 2013-10-04 DIAGNOSIS — Z79899 Other long term (current) drug therapy: Secondary | ICD-10-CM | POA: Insufficient documentation

## 2013-10-04 DIAGNOSIS — R1012 Left upper quadrant pain: Secondary | ICD-10-CM | POA: Insufficient documentation

## 2013-10-04 DIAGNOSIS — R209 Unspecified disturbances of skin sensation: Secondary | ICD-10-CM | POA: Insufficient documentation

## 2013-10-04 DIAGNOSIS — R55 Syncope and collapse: Secondary | ICD-10-CM | POA: Insufficient documentation

## 2013-10-04 DIAGNOSIS — K219 Gastro-esophageal reflux disease without esophagitis: Secondary | ICD-10-CM | POA: Insufficient documentation

## 2013-10-04 DIAGNOSIS — R112 Nausea with vomiting, unspecified: Secondary | ICD-10-CM

## 2013-10-04 DIAGNOSIS — R42 Dizziness and giddiness: Secondary | ICD-10-CM | POA: Insufficient documentation

## 2013-10-04 DIAGNOSIS — I10 Essential (primary) hypertension: Secondary | ICD-10-CM | POA: Insufficient documentation

## 2013-10-04 HISTORY — DX: Gastro-esophageal reflux disease without esophagitis: K21.9

## 2013-10-04 LAB — CBC WITH DIFFERENTIAL/PLATELET
BASOS ABS: 0 10*3/uL (ref 0.0–0.1)
Basophils Relative: 0 % (ref 0–1)
Eosinophils Absolute: 0 10*3/uL (ref 0.0–0.7)
Eosinophils Relative: 0 % (ref 0–5)
HEMATOCRIT: 42.4 % (ref 39.0–52.0)
HEMOGLOBIN: 14.3 g/dL (ref 13.0–17.0)
LYMPHS ABS: 2.2 10*3/uL (ref 0.7–4.0)
Lymphocytes Relative: 19 % (ref 12–46)
MCH: 29.9 pg (ref 26.0–34.0)
MCHC: 33.7 g/dL (ref 30.0–36.0)
MCV: 88.7 fL (ref 78.0–100.0)
Monocytes Absolute: 1.1 10*3/uL — ABNORMAL HIGH (ref 0.1–1.0)
Monocytes Relative: 10 % (ref 3–12)
Neutro Abs: 8 10*3/uL — ABNORMAL HIGH (ref 1.7–7.7)
Neutrophils Relative %: 71 % (ref 43–77)
PLATELETS: 250 10*3/uL (ref 150–400)
RBC: 4.78 MIL/uL (ref 4.22–5.81)
RDW: 13.4 % (ref 11.5–15.5)
WBC: 11.3 10*3/uL — ABNORMAL HIGH (ref 4.0–10.5)

## 2013-10-04 LAB — COMPREHENSIVE METABOLIC PANEL
ALBUMIN: 4.8 g/dL (ref 3.5–5.2)
ALT: 17 U/L (ref 0–53)
AST: 23 U/L (ref 0–37)
Alkaline Phosphatase: 75 U/L (ref 39–117)
BUN: 14 mg/dL (ref 6–23)
CALCIUM: 9.9 mg/dL (ref 8.4–10.5)
CHLORIDE: 95 meq/L — AB (ref 96–112)
CO2: 21 meq/L (ref 19–32)
CREATININE: 0.78 mg/dL (ref 0.50–1.35)
GFR calc Af Amer: >90 mL/min (ref >90)
GFR calc non Af Amer: >90 mL/min (ref >90)
Glucose, Bld: 97 mg/dL (ref 70–99)
Potassium: 3.4 mEq/L — ABNORMAL LOW (ref 3.7–5.3)
SODIUM: 136 meq/L — AB (ref 137–147)
TOTAL PROTEIN: 8.7 g/dL — AB (ref 6.0–8.3)
Total Bilirubin: 0.3 mg/dL (ref 0.3–1.2)

## 2013-10-04 LAB — I-STAT TROPONIN, ED: Troponin i, poc: 0.01 ng/mL (ref 0.00–0.08)

## 2013-10-04 MED ORDER — SODIUM CHLORIDE 0.9 % IV BOLUS (SEPSIS)
1000.0000 mL | Freq: Once | INTRAVENOUS | Status: AC
  Administered 2013-10-04: 1000 mL via INTRAVENOUS

## 2013-10-04 MED ORDER — GI COCKTAIL ~~LOC~~
30.0000 mL | Freq: Once | ORAL | Status: AC
  Administered 2013-10-04: 30 mL via ORAL
  Filled 2013-10-04: qty 30

## 2013-10-04 MED ORDER — ONDANSETRON HCL 4 MG PO TABS: 4.0000 mg | ORAL_TABLET | Freq: Four times a day (QID) | ORAL | Status: DC

## 2013-10-04 MED ORDER — PANTOPRAZOLE SODIUM 40 MG PO TBEC
40.0000 mg | DELAYED_RELEASE_TABLET | Freq: Every day | ORAL | Status: DC
  Administered 2013-10-04: 40 mg via ORAL
  Filled 2013-10-04: qty 1

## 2013-10-04 MED ORDER — ONDANSETRON HCL 4 MG/2ML IJ SOLN
4.0000 mg | Freq: Once | INTRAMUSCULAR | Status: AC
  Administered 2013-10-04: 4 mg via INTRAVENOUS
  Filled 2013-10-04: qty 2

## 2013-10-04 NOTE — ED Provider Notes (Signed)
CSN: [id number]     Arrival date & time 10/04/13  1441 History   First MD Initiated Contact with Patient 10/04/13 1752     Chief Complaint  Patient presents with  . Emesis  . Nausea     (Consider location/radiation/quality/duration/timing/severity/associated sxs/prior Treatment) Patient is a 33 y.o. male presenting with vomiting. The history is provided by the patient. No language interpreter was used.  Emesis Severity:  Mild Duration:  12 hours Timing:  Intermittent Number of daily episodes:  3 Emesis appearance: 3rd episode had small spots of BRB. Chronicity:  New Recent urination:  Normal Relieved by:  Nothing Worsened by:  Nothing tried Ineffective treatments:  None tried Associated symptoms: abdominal pain   Associated symptoms: no arthralgias, no chills, no cough, no diarrhea, no fever, no headaches, no myalgias, no sore throat and no URI   Associated symptoms comment:  Pt had about 1 min episode of lightheadedness, near syncope, tingling in hands/feet and cramping in L hand after episode of emesis around 1pm. Abdominal pain:    Location:  LUQ   Quality:  Aching   Severity:  Moderate   Duration:  1 day   Timing:  Constant   Progression:  Unchanged   Chronicity:  Recurrent Risk factors: no alcohol use, no diabetes, not pregnant now, no prior abdominal surgery, no sick contacts, no suspect food intake and no travel to endemic areas     Past Medical History  Diagnosis Date  . Hypertension   . GERD (gastroesophageal reflux disease)    History reviewed. No pertinent past surgical history. No family history on file. History  Substance Use Topics  . Smoking status: Never Smoker   . Smokeless tobacco: Never Used  . Alcohol Use: No    Review of Systems  Constitutional: Negative for fever, chills, activity change, appetite change and fatigue.  HENT: Negative for congestion, facial swelling, rhinorrhea, sore throat and trouble swallowing.   Eyes: Negative for  photophobia and pain.  Respiratory: Negative for cough, chest tightness and shortness of breath.   Cardiovascular: Negative for chest pain and leg swelling.  Gastrointestinal: Positive for nausea, vomiting and abdominal pain. Negative for diarrhea and constipation.  Endocrine: Negative for polydipsia and polyuria.  Genitourinary: Negative for dysuria, urgency, decreased urine volume and difficulty urinating.  Musculoskeletal: Negative for arthralgias, back pain, gait problem and myalgias.  Skin: Negative for color change, rash and wound.  Allergic/Immunologic: Negative for immunocompromised state.  Neurological: Negative for dizziness, facial asymmetry, speech difficulty, weakness, numbness and headaches.       Tingling   Psychiatric/Behavioral: Negative for confusion, decreased concentration and agitation.      Allergies  Review of patient's allergies indicates no known allergies.  Home Medications   Prior to Admission medications   Medication Sig Start Date End Date Taking? Authorizing Provider  amLODipine (NORVASC) 5 MG tablet Take 5 mg by mouth daily.   Yes Historical Provider, MD  chlorthalidone (HYGROTON) 25 MG tablet Take 1 tablet (25 mg total) by mouth daily. 12/20/11 12/19/12  Tobey GrimJeffrey H Walden, MD  ondansetron (ZOFRAN) 4 MG tablet Take 1 tablet (4 mg total) by mouth every 6 (six) hours. 10/04/13   Shanna CiscoMegan E Docherty, MD   BP 124/61  Pulse 78  Temp(Src) 98.4 F (36.9 C) (Oral)  Resp 17  Ht 6' (1.829 m)  Wt 216 lb (97.977 kg)  BMI 29.29 kg/m2  SpO2 100% Physical Exam  Constitutional: He is oriented to person, place, and time. He appears well-developed and  well-nourished. No distress.  HENT:  Head: Normocephalic and atraumatic.  Mouth/Throat: No oropharyngeal exudate.  Eyes: Pupils are equal, round, and reactive to light.  Neck: Normal range of motion. Neck supple.  Cardiovascular: Normal rate, regular rhythm and normal heart sounds.  Exam reveals no gallop and no  friction rub.   No murmur heard. Pulmonary/Chest: Effort normal and breath sounds normal. No respiratory distress. He has no wheezes. He has no rales.  Abdominal: Soft. Bowel sounds are normal. He exhibits no distension and no mass. There is tenderness in the left upper quadrant. There is no rebound and no guarding.  Musculoskeletal: Normal range of motion. He exhibits no edema and no tenderness.  Neurological: He is alert and oriented to person, place, and time.  Skin: Skin is warm and dry.  Psychiatric: He has a normal mood and affect.    ED Course  Procedures (including critical care time) Labs Review Labs Reviewed  COMPREHENSIVE METABOLIC PANEL - Abnormal; Notable for the following:    Sodium 136 (*)    Potassium 3.4 (*)    Chloride 95 (*)    Total Protein 8.7 (*)    All other components within normal limits  CBC WITH DIFFERENTIAL - Abnormal; Notable for the following:    WBC 11.3 (*)    Neutro Abs 8.0 (*)    Monocytes Absolute 1.1 (*)    All other components within normal limits  I-STAT TROPOININ, ED    Imaging Review Dg Chest 2 View  10/04/2013   CLINICAL DATA:  Nausea and vomiting with tingling in the upper extremities  EXAM: CHEST  2 VIEW  COMPARISON:  None.  FINDINGS: The lungs are adequately inflated and clear. The heart and mediastinal structures are normal. The bony thorax is normal.  IMPRESSION: There is no acute cardiopulmonary abnormality.   Electronically Signed   By: David  [place]   On: 10/04/2013 16:43     EKG Interpretation   Date/Time:  Thursday October 04 2013 14:47:06 EDT Ventricular Rate:  76 PR Interval:  152 QRS Duration: 88 QT Interval:  374 QTC Calculation: 420 R Axis:   78 Text Interpretation:  Normal sinus rhythm with sinus arrhythmia Normal ECG  Confirmed by DOCHERTY  MD, MEGAN ([id number]) on 10/04/2013 6:19:07 PM      MDM   Final diagnoses:  Near syncope  GERD (gastroesophageal reflux disease)  Nausea & vomiting    Pt is a 33 y.o. male  with Pmhx as above who presents with 3 episodes of nonbilious emesis, the 3rd episode with small spots of BRB, continued nausea, and 10 min episode of hand/feet cramping tingling which has resolved. On PE, VSS, pt in NAD. +LUQ ttp w/o rebound or guarding. Cardiopulm exam nml. Triage orders included nml CXR, nml Trop, mild WBC elevation, mild hyponatremia, kalemia, and chloremia. Cr. Stable. Suspect tingling/cramping related to near syncopal episode and or hyperventilation. Pt states symptoms otherwise are similar to prior GERD exacerbations. Doubt acute surgical cause of cause given reassuring exam. Will treat symptoms w/ IVF, zofran, GI cocktail.    Pt feeling improved, would like to go home. He has Nexium at home. Rx for zofran given. Return precautions given for new or worsening symptoms including worsening pain, fever, passing out, inability to tolerate liquids.         Shanna Cisco, MD 10/05/13 1231

## 2013-10-04 NOTE — Discharge Instructions (Signed)
Near-Syncope Near-syncope (commonly known as near fainting) is sudden weakness, dizziness, or feeling like you might pass out. During an episode of near-syncope, you may also develop pale skin, have tunnel vision, or feel sick to your stomach (nauseous). Near-syncope may occur when getting up after sitting or while standing for a long time. It is caused by a sudden decrease in blood flow to the brain. This decrease can result from various causes or triggers, most of which are not serious. However, because near-syncope can sometimes be a sign of something serious, a medical evaluation is required. The specific cause is often not determined. HOME CARE INSTRUCTIONS  Monitor your condition for any changes. The following actions may help to alleviate any discomfort you are experiencing:  Have someone stay with you until you feel stable.  Lie down right away if you start feeling like you might faint. Breathe deeply and steadily. Wait until all the symptoms have passed. Most of these episodes last only a few minutes. You may feel tired for several hours.   Drink enough fluids to keep your urine clear or pale yellow.   If you are taking blood pressure or heart medicine, get up slowly when seated or lying down. Take several minutes to sit and then stand. This can reduce dizziness.  Follow up with your health care provider as directed. SEEK IMMEDIATE MEDICAL CARE IF:   You have a severe headache.   You have unusual pain in the chest, abdomen, or back.   You are bleeding from the mouth or rectum, or you have black or tarry stool.   You have an irregular or very fast heartbeat.   You have repeated fainting or have seizure-like jerking during an episode.   You faint when sitting or lying down.   You have confusion.   You have difficulty walking.   You have severe weakness.   You have vision problems.  MAKE SURE YOU:   Understand these instructions.  Will watch your  condition.  Will get help right away if you are not doing well or get worse. Document Released: 04/12/2005 Document Revised: 12/13/2012 Document Reviewed: 09/15/2012 [facility] Patient Information 2014 [place], [place].  Diet for Gastroesophageal Reflux Disease, Adult Reflux (acid reflux) is when acid from your stomach flows up into the esophagus. When acid comes in contact with the esophagus, the acid causes irritation and soreness (inflammation) in the esophagus. When reflux happens often or so severely that it causes damage to the esophagus, it is called gastroesophageal reflux disease (GERD). Nutrition therapy can help ease the discomfort of GERD. FOODS OR DRINKS TO AVOID OR LIMIT  Smoking or chewing tobacco. Nicotine is one of the most potent stimulants to acid production in the gastrointestinal tract.  Caffeinated and decaffeinated coffee and black tea.  Regular or low-calorie carbonated beverages or energy drinks (caffeine-free carbonated beverages are allowed).   Strong spices, such as black pepper, white pepper, red pepper, cayenne, curry powder, and chili powder.  Peppermint or spearmint.  Chocolate.  High-fat foods, including meats and fried foods. Extra added fats including oils, butter, salad dressings, and nuts. Limit these to less than 8 tsp per day.  Fruits and vegetables if they are not tolerated, such as citrus fruits or tomatoes.  Alcohol.  Any food that seems to aggravate your condition. If you have questions regarding your diet, call your caregiver or a registered dietitian. OTHER THINGS THAT MAY HELP GERD INCLUDE:   Eating your meals slowly, in a relaxed setting.  Eating  5 to 6 small meals per day instead of 3 large meals.  Eliminating food for a period of time if it causes distress.  Not lying down until 3 hours after eating a meal.  Keeping the head of your bed raised 6 to 9 inches (15 to 23 cm) by using a foam wedge or blocks under the legs of the bed.  Lying flat may make symptoms worse.  Being physically active. Weight loss may be helpful in reducing reflux in overweight or obese adults.  Wear loose fitting clothing EXAMPLE MEAL PLAN This meal plan is approximately 2,000 calories based on https://www.[facility].org/ meal planning guidelines. Breakfast   cup cooked oatmeal.  1 cup strawberries.  1 cup low-fat milk.  1 oz almonds. Snack  1 cup cucumber slices.  6 oz yogurt (made from low-fat or fat-free milk). Lunch  2 slice whole-wheat bread.  2 oz sliced [place].  2 tsp mayonnaise.  1 cup blueberries.  1 cup snap peas. Snack  6 whole-wheat crackers.  1 oz string cheese. Dinner   cup brown rice.  1 cup mixed veggies.  1 tsp olive oil.  3 oz grilled fish. Document Released: 04/12/2005 Document Revised: 07/05/2011 Document Reviewed: 02/26/2011 [facility] Patient Information 2014 [place], [place].

## 2013-10-04 NOTE — ED Notes (Signed)
Patient with reported onset of n/v today at 0800.  He has hx gerd and htn.  He has not taken his meds for either.  Patient called ems due to n/v.  Patient with blood in emesis and tingling in left arm.  ems reports ekg normal.  No s/sx of stroke.  cbg 130.  He is htn.  Patient with iv placed by ems.  No meds prior to arrival.

## 2013-10-08 ENCOUNTER — Ambulatory Visit (INDEPENDENT_AMBULATORY_CARE_PROVIDER_SITE_OTHER): Payer: Self-pay | Admitting: Family Medicine

## 2013-10-08 ENCOUNTER — Encounter: Payer: Self-pay | Admitting: Family Medicine

## 2013-10-08 VITALS — BP 136/76 | HR 98 | Temp 99.3°F | Ht 71.0 in | Wt 216.0 lb

## 2013-10-08 DIAGNOSIS — J019 Acute sinusitis, unspecified: Secondary | ICD-10-CM | POA: Insufficient documentation

## 2013-10-08 MED ORDER — AMOXICILLIN 500 MG PO CAPS: 500.0000 mg | ORAL_CAPSULE | Freq: Three times a day (TID) | ORAL | Status: DC

## 2013-10-08 MED ORDER — FLUTICASONE PROPIONATE 50 MCG/ACT NA SUSP: 1.0000 | Freq: Every day | NASAL | Status: DC

## 2013-10-08 NOTE — Patient Instructions (Signed)

## 2013-10-11 ENCOUNTER — Encounter: Payer: Self-pay | Admitting: Family Medicine

## 2013-10-11 NOTE — Progress Notes (Signed)
   Subjective:    Patient ID: Johnathan Roy, male    DOB: Sep 03, 1980, 33 y.o.   MRN: [id number]  HPI Johnathan Roy is a 33 y.o. male presents to family medicine clinic today with complaints of headache.  Headache: States he has a headache since Thursday (4 days ago) that he states is progressively becoming worse. He points to his bilateral eyes and forehead for location of pain. Is not having headache history. He endorses chills, congestion, facial pressure and nausea. He denies fever or epistaxis. He is attempted to take Tylenol for his pain which he states is not helping. He is unable to take NSAIDs due to his peptic ulcer disease. Patient has a history of seasonal allergies. Currently is not taking an antihistamine. He denies photophobia, visual changes or tinnitus. He is tolerating fluids and food without difficulty. Patient is a nonsmoker  Review of Systems Per HPI    Objective:   Physical Exam BP 136/76  Pulse 98  Temp(Src) 99.3 F (37.4 C) (Oral)  Ht 5\' 11"  (1.803 m)  Wt 216 lb (97.977 kg)  BMI 30.14 kg/m2 Gen: NAD. Pleasant and cooperative. African American male. Appears fatigued. HEENT: AT. [place]. Bilateral TM visualized and normal in appearance. Bilateral eyes without injections or icterus. MMM. Bilateral nares with erythema and swelling. Throat without erythema or exudates. Facial pressure over bilateral maxillary sinuses and frontal sinuses. CV: RRR  Chest: CTAB, no wheeze or crackles Abd: Soft. Flat NTND. BS present. No Masses palpated.  Skin: No rashes, purpura or petechiae.

## 2013-10-11 NOTE — Assessment & Plan Note (Signed)
Patient with fever, facial pressure and discharge. Prescribe Flonase and amoxicillin. Tylenol for fever and pain. Unable to take NSAIDs due to peptic ulcer disease. Followup 2 weeks

## 2016-04-23 ENCOUNTER — Encounter (HOSPITAL_COMMUNITY): Payer: Self-pay | Admitting: Emergency Medicine

## 2016-04-23 ENCOUNTER — Ambulatory Visit (HOSPITAL_COMMUNITY)
Admission: EM | Admit: 2016-04-23 | Discharge: 2016-04-23 | Disposition: A | Discharge status: Home / Self Care | Payer: Self-pay | Attending: Family Medicine | Admitting: Family Medicine

## 2016-04-23 DIAGNOSIS — R6889 Other general symptoms and signs: Secondary | ICD-10-CM

## 2016-04-23 MED ORDER — OSELTAMIVIR PHOSPHATE 75 MG PO CAPS: 75.0000 mg | ORAL_CAPSULE | Freq: Two times a day (BID) | ORAL | 0 refills | Status: DC

## 2016-04-23 NOTE — ED Triage Notes (Signed)
Pt woke up yesterday with nasal congestion, headache, ear pressure, body aches and possible fever.  Pt has taken tylenol and nyquil with no relief.

## 2016-04-23 NOTE — ED Provider Notes (Signed)
CSN: [id number]     Arrival date & time 04/23/16  1025 History   None    Chief Complaint  Patient presents with  . URI   (Consider location/radiation/quality/duration/timing/severity/associated sxs/prior Treatment) The history is provided by the patient. No language interpreter was used.  URI  Presenting symptoms: congestion and cough   Severity:  Moderate Onset quality:  Gradual Timing:  Constant Progression:  Worsening Chronicity:  New Relieved by:  Nothing Worsened by:  Nothing Ineffective treatments:  None tried Associated symptoms: no headaches   Risk factors: no sick contacts     Past Medical History:  Diagnosis Date  . GERD (gastroesophageal reflux disease)   . Hypertension    History reviewed. No pertinent surgical history. History reviewed. No pertinent family history. Social History  Substance Use Topics  . Smoking status: Never Smoker  . Smokeless tobacco: Never Used  . Alcohol use No    Review of Systems  HENT: Positive for congestion.   Respiratory: Positive for cough.   Neurological: Negative for headaches.  All other systems reviewed and are negative.   Allergies  Patient has no known allergies.  Home Medications   Prior to Admission medications   Medication Sig Start Date End Date Taking? Authorizing Provider  amLODipine (NORVASC) 5 MG tablet Take 5 mg by mouth daily.    Historical Provider, MD  amoxicillin (AMOXIL) 500 MG capsule Take 1 capsule (500 mg total) by mouth 3 (three) times daily. 10/08/13   Renee A Kuneff, DO  chlorthalidone (HYGROTON) 25 MG tablet Take 1 tablet (25 mg total) by mouth daily. 12/20/11 12/19/12  Tobey GrimJeffrey H Walden, MD  fluticasone (FLONASE) 50 MCG/ACT nasal spray Place 1 spray into both nostrils daily. 10/08/13   Renee A Kuneff, DO  ondansetron (ZOFRAN) 4 MG tablet Take 1 tablet (4 mg total) by mouth every 6 (six) hours. 10/04/13   Toy CookeyMegan Docherty, MD  oseltamivir (TAMIFLU) 75 MG capsule Take 1 capsule (75 mg total) by mouth  every 12 (twelve) hours. 04/23/16   Elson AreasLeslie K Sofia, PA-C   Meds Ordered and Administered this Visit  Medications - No data to display  BP 135/90 (BP Location: Right Arm)   Pulse 83   Temp 98.2 F (36.8 C) (Oral)   SpO2 98%  No data found.   Physical Exam  Constitutional: He appears well-developed and well-nourished.  HENT:  Head: Normocephalic and atraumatic.  Eyes: EOM are normal. Pupils are equal, round, and reactive to light.  Neck: Normal range of motion.  Cardiovascular: Normal rate and regular rhythm.   Pulmonary/Chest: Effort normal.  Abdominal: Soft.  Musculoskeletal: Normal range of motion.  Neurological: He is alert.  Skin: Skin is warm.  Psychiatric: He has a normal mood and affect.  Nursing note and vitals reviewed.   Urgent Care Course   Clinical Course     Procedures (including critical care time)  Labs Review Labs Reviewed - No data to display  Imaging Review No results found.   Visual Acuity Review  Right Eye Distance:   Left Eye Distance:   Bilateral Distance:    Right Eye Near:   Left Eye Near:    Bilateral Near:         MDM   1. Flu-like symptoms    An After Visit Summary was printed and given to the patient. Meds ordered this encounter  Medications  . oseltamivir (TAMIFLU) 75 MG capsule    Sig: Take 1 capsule (75 mg total) by mouth every 12 (twelve)  hours.    Dispense:  10 capsule    Refill:  0    Order Specific Question:   Supervising Provider    Answer:   Bradd CanaryKINDL, JAMES D [[id number]]     Lonia SkinnerLeslie K McHenrySofia, PA-C 04/23/16 1454

## 2016-05-31 DIAGNOSIS — Z0001 Encounter for general adult medical examination with abnormal findings: Secondary | ICD-10-CM | POA: Diagnosis not present

## 2016-05-31 DIAGNOSIS — Z8679 Personal history of other diseases of the circulatory system: Secondary | ICD-10-CM | POA: Diagnosis not present

## 2016-05-31 DIAGNOSIS — Z23 Encounter for immunization: Secondary | ICD-10-CM | POA: Diagnosis not present

## 2016-05-31 DIAGNOSIS — R61 Generalized hyperhidrosis: Secondary | ICD-10-CM | POA: Diagnosis not present

## 2016-06-04 DIAGNOSIS — Z8679 Personal history of other diseases of the circulatory system: Secondary | ICD-10-CM | POA: Diagnosis not present

## 2016-06-04 DIAGNOSIS — Z0001 Encounter for general adult medical examination with abnormal findings: Secondary | ICD-10-CM | POA: Diagnosis not present

## 2016-06-04 DIAGNOSIS — R61 Generalized hyperhidrosis: Secondary | ICD-10-CM | POA: Diagnosis not present

## 2016-07-09 ENCOUNTER — Encounter (HOSPITAL_COMMUNITY): Payer: Self-pay

## 2016-07-09 ENCOUNTER — Emergency Department (HOSPITAL_COMMUNITY)
Admission: EM | Admit: 2016-07-09 | Discharge: 2016-07-09 | Disposition: A | Discharge status: Home / Self Care | Payer: 59 | Attending: Emergency Medicine | Admitting: Emergency Medicine

## 2016-07-09 DIAGNOSIS — Z79899 Other long term (current) drug therapy: Secondary | ICD-10-CM | POA: Insufficient documentation

## 2016-07-09 DIAGNOSIS — Y939 Activity, unspecified: Secondary | ICD-10-CM | POA: Insufficient documentation

## 2016-07-09 DIAGNOSIS — Y999 Unspecified external cause status: Secondary | ICD-10-CM | POA: Insufficient documentation

## 2016-07-09 DIAGNOSIS — M545 Low back pain, unspecified: Secondary | ICD-10-CM

## 2016-07-09 DIAGNOSIS — Y92410 Unspecified street and highway as the place of occurrence of the external cause: Secondary | ICD-10-CM | POA: Diagnosis not present

## 2016-07-09 DIAGNOSIS — I10 Essential (primary) hypertension: Secondary | ICD-10-CM | POA: Diagnosis not present

## 2016-07-09 DIAGNOSIS — S01511A Laceration without foreign body of lip, initial encounter: Secondary | ICD-10-CM | POA: Diagnosis not present

## 2016-07-09 MED ORDER — METHOCARBAMOL 500 MG PO TABS: 500.0000 mg | ORAL_TABLET | Freq: Every evening | ORAL | 0 refills | Status: DC | PRN

## 2016-07-09 MED ORDER — IBUPROFEN 600 MG PO TABS: 600.0000 mg | ORAL_TABLET | Freq: Four times a day (QID) | ORAL | 0 refills | Status: DC | PRN
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
## 2016-07-09 NOTE — Discharge Instructions (Signed)
Take Ibuprofen three times daily for the next week. Take this medicine with food. Take muscle relaxer at bedtime to help you sleep. This medicine makes you drowsy so do not take before driving or work Use a heating pad for sore muscles - use for 20 minutes several times a day Follow up with dentist regarding tooth Return for worsening symptoms

## 2016-07-11 DIAGNOSIS — S5012XD Contusion of left forearm, subsequent encounter: Secondary | ICD-10-CM | POA: Diagnosis not present

## 2016-07-11 DIAGNOSIS — S8002XD Contusion of left knee, subsequent encounter: Secondary | ICD-10-CM | POA: Diagnosis not present

## 2016-07-11 DIAGNOSIS — S298XXD Other specified injuries of thorax, subsequent encounter: Secondary | ICD-10-CM | POA: Diagnosis not present

## 2016-07-11 DIAGNOSIS — S20222D Contusion of left back wall of thorax, subsequent encounter: Secondary | ICD-10-CM | POA: Diagnosis not present

## 2016-07-11 DIAGNOSIS — R1032 Left lower quadrant pain: Secondary | ICD-10-CM | POA: Diagnosis not present

## 2018-07-06 ENCOUNTER — Ambulatory Visit (HOSPITAL_COMMUNITY)
Admission: EM | Admit: 2018-07-06 | Discharge: 2018-07-06 | Disposition: A | Discharge status: Home / Self Care | Payer: No Typology Code available for payment source | Attending: Urgent Care | Admitting: Urgent Care

## 2018-07-06 ENCOUNTER — Encounter (HOSPITAL_COMMUNITY): Payer: Self-pay | Admitting: Emergency Medicine

## 2018-07-06 ENCOUNTER — Other Ambulatory Visit: Payer: Self-pay

## 2018-07-06 ENCOUNTER — Ambulatory Visit (INDEPENDENT_AMBULATORY_CARE_PROVIDER_SITE_OTHER): Payer: No Typology Code available for payment source

## 2018-07-06 DIAGNOSIS — R102 Pelvic and perineal pain: Secondary | ICD-10-CM | POA: Diagnosis not present

## 2018-07-06 DIAGNOSIS — M5410 Radiculopathy, site unspecified: Secondary | ICD-10-CM

## 2018-07-06 DIAGNOSIS — M25551 Pain in right hip: Secondary | ICD-10-CM | POA: Diagnosis not present

## 2018-07-06 MED ORDER — PREDNISONE 20 MG PO TABS: ORAL_TABLET | ORAL | 0 refills | Status: DC

## 2018-07-06 MED ORDER — CYCLOBENZAPRINE HCL 10 MG PO TABS: 10.0000 mg | ORAL_TABLET | Freq: Three times a day (TID) | ORAL | 0 refills | Status: DC | PRN

## 2018-07-06 NOTE — ED Triage Notes (Signed)
Pt presents to [facility] for assessment of right lower back pain, hip pain, and leg pain x 1 week.  States in the last two days his leg has been giving out, and he has to catch himself.  States he has been driving more for work in the past week
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
## 2019-04-08 ENCOUNTER — Ambulatory Visit (HOSPITAL_COMMUNITY)
Admission: EM | Admit: 2019-04-08 | Discharge: 2019-04-08 | Disposition: A | Discharge status: Home / Self Care | Payer: No Typology Code available for payment source | Attending: Family Medicine | Admitting: Family Medicine

## 2019-04-08 ENCOUNTER — Other Ambulatory Visit: Payer: Self-pay

## 2019-04-08 ENCOUNTER — Encounter (HOSPITAL_COMMUNITY): Payer: Self-pay

## 2019-04-08 DIAGNOSIS — K089 Disorder of teeth and supporting structures, unspecified: Secondary | ICD-10-CM

## 2019-04-08 MED ORDER — HYDROCODONE-ACETAMINOPHEN 5-325 MG PO TABS: 2.0000 | ORAL_TABLET | ORAL | 0 refills | Status: DC | PRN

## 2019-04-08 MED ORDER — PENICILLIN V POTASSIUM 500 MG PO TABS: 500.0000 mg | ORAL_TABLET | Freq: Four times a day (QID) | ORAL | 0 refills | Status: AC

## 2019-04-08 NOTE — ED Provider Notes (Signed)
[facility]    CSN: [id number] Arrival date & time: 04/08/19  1013      History   Chief Complaint Chief Complaint  Patient presents with  . Oral Swelling    HPI Johnathan Roy is a 38 y.o. male.   Patient presents with pain and swelling in his right lower gum.  About 1 month ago he broke off first molar in that area.  Was not able to get the tooth pulled or fixed and it has not bothered him until yesterday when he noted the onset of pain around that tooth and in the gum.  HPI  Past Medical History:  Diagnosis Date  . GERD (gastroesophageal reflux disease)   . Hypertension     Patient Active Problem List   Diagnosis Date Noted  . Acute sinusitis 10/08/2013  . Back pain 12/22/2011  . HYPERCHOLESTEROLEMIA 05/01/2009  . PES PLANUS 03/01/2008  . GASTRITIS 10/02/2007  . TOBACCO ABUSE 01/02/2007  . HYPERTENSION, BENIGN ESSENTIAL 01/02/2007  . PEPTIC ULCER DISEASE 01/02/2007    History reviewed. No pertinent surgical history.     Home Medications    Prior to Admission medications   Medication Sig Start Date End Date Taking? Authorizing Provider  cyclobenzaprine (FLEXERIL) 10 MG tablet Take 1 tablet (10 mg total) by mouth 3 (three) times daily as needed for muscle spasms. 07/06/18   Wallis Bamberg, PA-C  predniSONE (DELTASONE) 20 MG tablet Take 2 tablets daily with breakfast. 07/06/18   Wallis Bamberg, PA-C    Family History Family History  Problem Relation Age of Onset  . Cancer Mother   . Cancer Father     Social History Social History   Tobacco Use  . Smoking status: Never Smoker  . Smokeless tobacco: Never Used  Substance Use Topics  . Alcohol use: No  . Drug use: Yes    Types: Marijuana    Comment: twice daily     Allergies   Patient has no known allergies.   Review of Systems Review of Systems  HENT: Positive for dental problem.   All other systems reviewed and are negative.    Physical Exam Triage Vital Signs ED Triage Vitals    Enc Vitals Group     BP 04/08/19 1108 (!) 167/80     Pulse Rate 04/08/19 1108 92     Resp 04/08/19 1108 16     Temp 04/08/19 1108 98.5 F (36.9 C)     Temp Source 04/08/19 1108 Oral     SpO2 04/08/19 1108 97 %     Weight --      Height --      Head Circumference --      Peak Flow --      Pain Score 04/08/19 1106 8     Pain Loc --      Pain Edu? --      Excl. in GC? --    No data found.  Updated Vital Signs BP (!) 167/80 (BP Location: Right Arm)   Pulse 92   Temp 98.5 F (36.9 C) (Oral)   Resp 16   SpO2 97%   Visual Acuity Right Eye Distance:   Left Eye Distance:   Bilateral Distance:    Right Eye Near:   Left Eye Near:    Bilateral Near:     Physical Exam Vitals and nursing note reviewed.  Constitutional:      Appearance: Normal appearance.  HENT:     Head: Normocephalic.  Mouth/Throat:     Comments: Mouth: First lower molar on the right is broken off it appears carious.  The gum around that tooth is erythematous and swollen.  It is tender to palpation with tongue blade. Neurological:     Mental Status: He is alert.      UC Treatments / Results  Labs (all labs ordered are listed, but only abnormal results are displayed) Labs Reviewed - No data to display  EKG   Radiology No results found.  Procedures Procedures (including critical care time)  Medications Ordered in UC Medications - No data to display  Initial Impression / Assessment and Plan / UC Course  I have reviewed the triage vital signs and the nursing notes.  Pertinent labs & imaging results that were available during my care of the patient were reviewed by me and considered in my medical decision making (see chart for details).     Probable dental abscess and periodontal disease Final Clinical Impressions(s) / UC Diagnoses   Final diagnoses:  None   Discharge Instructions   None    ED Prescriptions    None     PDMP not reviewed this encounter.   Wardell Honour,  MD 04/08/19 1131

## 2019-04-08 NOTE — ED Triage Notes (Signed)
Pt presents with swelling and pain in his gum x 1 day.

## 2020-05-12 ENCOUNTER — Other Ambulatory Visit: Payer: Self-pay

## 2020-05-12 ENCOUNTER — Encounter (HOSPITAL_COMMUNITY): Payer: Self-pay | Admitting: Emergency Medicine

## 2020-05-12 ENCOUNTER — Ambulatory Visit (HOSPITAL_COMMUNITY)
Admission: EM | Admit: 2020-05-12 | Discharge: 2020-05-12 | Disposition: A | Discharge status: Home / Self Care | Payer: HRSA Program | Attending: Physician Assistant | Admitting: Physician Assistant

## 2020-05-12 DIAGNOSIS — U071 COVID-19: Secondary | ICD-10-CM | POA: Diagnosis not present

## 2020-05-12 DIAGNOSIS — Z79899 Other long term (current) drug therapy: Secondary | ICD-10-CM | POA: Diagnosis not present

## 2020-05-12 DIAGNOSIS — R0789 Other chest pain: Secondary | ICD-10-CM | POA: Diagnosis present

## 2020-05-12 DIAGNOSIS — J069 Acute upper respiratory infection, unspecified: Secondary | ICD-10-CM

## 2020-05-12 MED ORDER — ALBUTEROL SULFATE HFA 108 (90 BASE) MCG/ACT IN AERS: 1.0000 | INHALATION_SPRAY | Freq: Four times a day (QID) | RESPIRATORY_TRACT | 0 refills | Status: AC | PRN

## 2020-05-12 NOTE — Discharge Instructions (Signed)
COVID test pending If positive self isolate 5 days from symptom onset and then wear a mask around others for 5 days.  Continue with Mucinex Use albuterol inhaler as needed.  Can taken ibuprofen or Tylenol for fever and body aches.  Push fluids Be seen in the ED if you develop difficulty breathing.

## 2020-05-12 NOTE — ED Provider Notes (Signed)
[facility]    CSN: [id number] Arrival date & time: 05/12/20  1643      History   Chief Complaint Chief Complaint  Patient presents with  . Cough  . Generalized Body Aches  . Chills  . Headache    HPI Johnathan Roy is a 40 y.o. male.   Pt complains of body aches, chills, HA, mild cough that started 4 days ago.  He reports noticing some chest tightness with coughing.  Denies chest pain, palpitations, radiation of pain, shortness of breath, n/v/d.  He has had his COVID vaccine.  He has taken Mucinex with temporary relief.     Past Medical History:  Diagnosis Date  . GERD (gastroesophageal reflux disease)   . Hypertension     Patient Active Problem List   Diagnosis Date Noted  . 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 Acute sinusitis 10/08/2013  . Back pain 12/22/2011  . HYPERCHOLESTEROLEMIA 05/01/2009  . PES PLANUS 03/01/2008  . GASTRITIS 10/02/2007  . TOBACCO ABUSE 01/02/2007  . HYPERTENSION, BENIGN ESSENTIAL 01/02/2007  . PEPTIC ULCER DISEASE 01/02/2007    History reviewed. No pertinent surgical history.     Home Medications    Prior to Admission medications   Medication Sig Start Date End Date Taking? Authorizing Provider  albuterol (VENTOLIN HFA) 108 (90 Base) MCG/ACT inhaler Inhale 1-2 puffs into the lungs every 6 (six) hours as needed for wheezing or shortness of breath. 05/12/20  Yes Jayliani Wanner, PA-C  cyclobenzaprine (FLEXERIL) 10 MG tablet Take 1 tablet (10 mg total) by mouth 3 (three) times daily as needed for muscle spasms. 07/06/18   Wallis Bamberg, PA-C  HYDROcodone-acetaminophen (NORCO/VICODIN) 5-325 MG tablet Take 2 tablets by mouth every 4 (four) hours as needed. 04/08/19   Frederica Kuster, MD  predniSONE (DELTASONE) 20 MG tablet Take 2 tablets daily with breakfast. 07/06/18   Wallis Bamberg, PA-C    Family History Family History  Problem Relation Age of Onset  . Cancer Mother   . Cancer Father     Social History Social History    Tobacco Use  . Smoking status: Never Smoker  . Smokeless tobacco: Never Used  Substance Use Topics  . Alcohol use: No  . Drug use: Yes    Types: Marijuana    Comment: twice daily     Allergies   Patient has no known allergies.   Review of Systems Review of Systems  Constitutional: Positive for chills and fever.  HENT: Positive for congestion. Negative for ear pain and sore throat.   Eyes: Negative for pain and visual disturbance.  Respiratory: Positive for cough. Negative for shortness of breath and wheezing.   Cardiovascular: Negative for chest pain and palpitations.  Gastrointestinal: Negative for abdominal pain, diarrhea, nausea and vomiting.  Genitourinary: Negative for dysuria and hematuria.  Musculoskeletal: Positive for myalgias. Negative for arthralgias and back pain.  Skin: Negative for color change and rash.  Neurological: Positive for headaches. Negative for seizures and syncope.  All other systems reviewed and are negative.    Physical Exam Triage Vital Signs ED Triage Vitals  Enc Vitals Group     BP 05/12/20 1733 (!) 155/89     Pulse Rate 05/12/20 1733 68     Resp 05/12/20 1733 18     Temp 05/12/20 1733 97.9 F (36.6 C)     Temp Source 05/12/20 1733 Oral     SpO2 05/12/20 1733 95 %     Weight --      Height --  Head Circumference --      Peak Flow --      Pain Score 05/12/20 1731 8     Pain Loc --      Pain Edu? --      Excl. in GC? --    No data found.  Updated Vital Signs BP (!) 155/89 (BP Location: Right Arm)   Pulse 68   Temp 97.9 F (36.6 C) (Oral)   Resp 18   SpO2 95%   Visual Acuity Right Eye Distance:   Left Eye Distance:   Bilateral Distance:    Right Eye Near:   Left Eye Near:    Bilateral Near:     Physical Exam Vitals and nursing note reviewed.  Constitutional:      Appearance: He is well-developed and well-nourished.  HENT:     Head: Normocephalic and atraumatic.  Eyes:     Conjunctiva/sclera: Conjunctivae  normal.  Cardiovascular:     Rate and Rhythm: Normal rate and regular rhythm.     Heart sounds: No murmur heard.   Pulmonary:     Effort: Pulmonary effort is normal. No respiratory distress.     Breath sounds: Normal breath sounds.  Abdominal:     Palpations: Abdomen is soft.     Tenderness: There is no abdominal tenderness.  Musculoskeletal:        General: No edema.     Cervical back: Neck supple.  Skin:    General: Skin is warm and dry.  Neurological:     Mental Status: He is alert.  Psychiatric:        Mood and Affect: Mood and affect normal.      UC Treatments / Results  Labs (all labs ordered are listed, but only abnormal results are displayed) Labs Reviewed  SARS CORONAVIRUS 2 (TAT 6-24 HRS)    EKG   Radiology No results found.  Procedures Procedures (including critical care time)  Medications Ordered in UC Medications - No data to display  Initial Impression / Assessment and Plan / UC Course  I have reviewed the triage vital signs and the nursing notes.  Pertinent labs & imaging results that were available during my care of the patient were reviewed by me and considered in my medical decision making (see chart for details).     Lungs clear, vitals WNL.  Will prescribed albuterol for prn use.  He will continue with OTC mucinex.  COVID test pending.  Self-isolation instructions given.  Strict return precautions given.  Final Clinical Impressions(s) / UC Diagnoses   Final diagnoses:  Viral upper respiratory tract infection     Discharge Instructions     COVID test pending If positive self isolate 5 days from symptom onset and then wear a mask around others for 5 days.  Continue with Mucinex Use albuterol inhaler as needed.  Can taken ibuprofen or Tylenol for fever and body aches.  Push fluids Be seen in the ED if you develop difficulty breathing.    ED Prescriptions    Medication Sig Dispense Auth. Provider   albuterol (VENTOLIN HFA) 108  (90 Base) MCG/ACT inhaler Inhale 1-2 puffs into the lungs every 6 (six) hours as needed for wheezing or shortness of breath. 1 each Jodell Cipro, PA-C     PDMP not reviewed this encounter.   Jodell Cipro, PA-C 05/12/20 1841

## 2020-05-12 NOTE — ED Triage Notes (Signed)
Pt states that he has a HA, body aches, cough, and chills. Pt states that his sx started Wednesday. Pt states that he tried OTC medication and nothing helped.

## 2020-05-13 LAB — SARS CORONAVIRUS 2 (TAT 6-24 HRS): SARS Coronavirus 2: POSITIVE — AB

## 2020-11-14 IMAGING — DX PELVIS - 1-2 VIEW
1 series · 1 of 1 positions shown · non-contrast
Comparison: None.

CLINICAL DATA: Patient states that he has right hip/pelvic pain x
2-3 days. No Hx NKI

EXAM:
PELVIS - 1-2 VIEW

[pelvis ap]
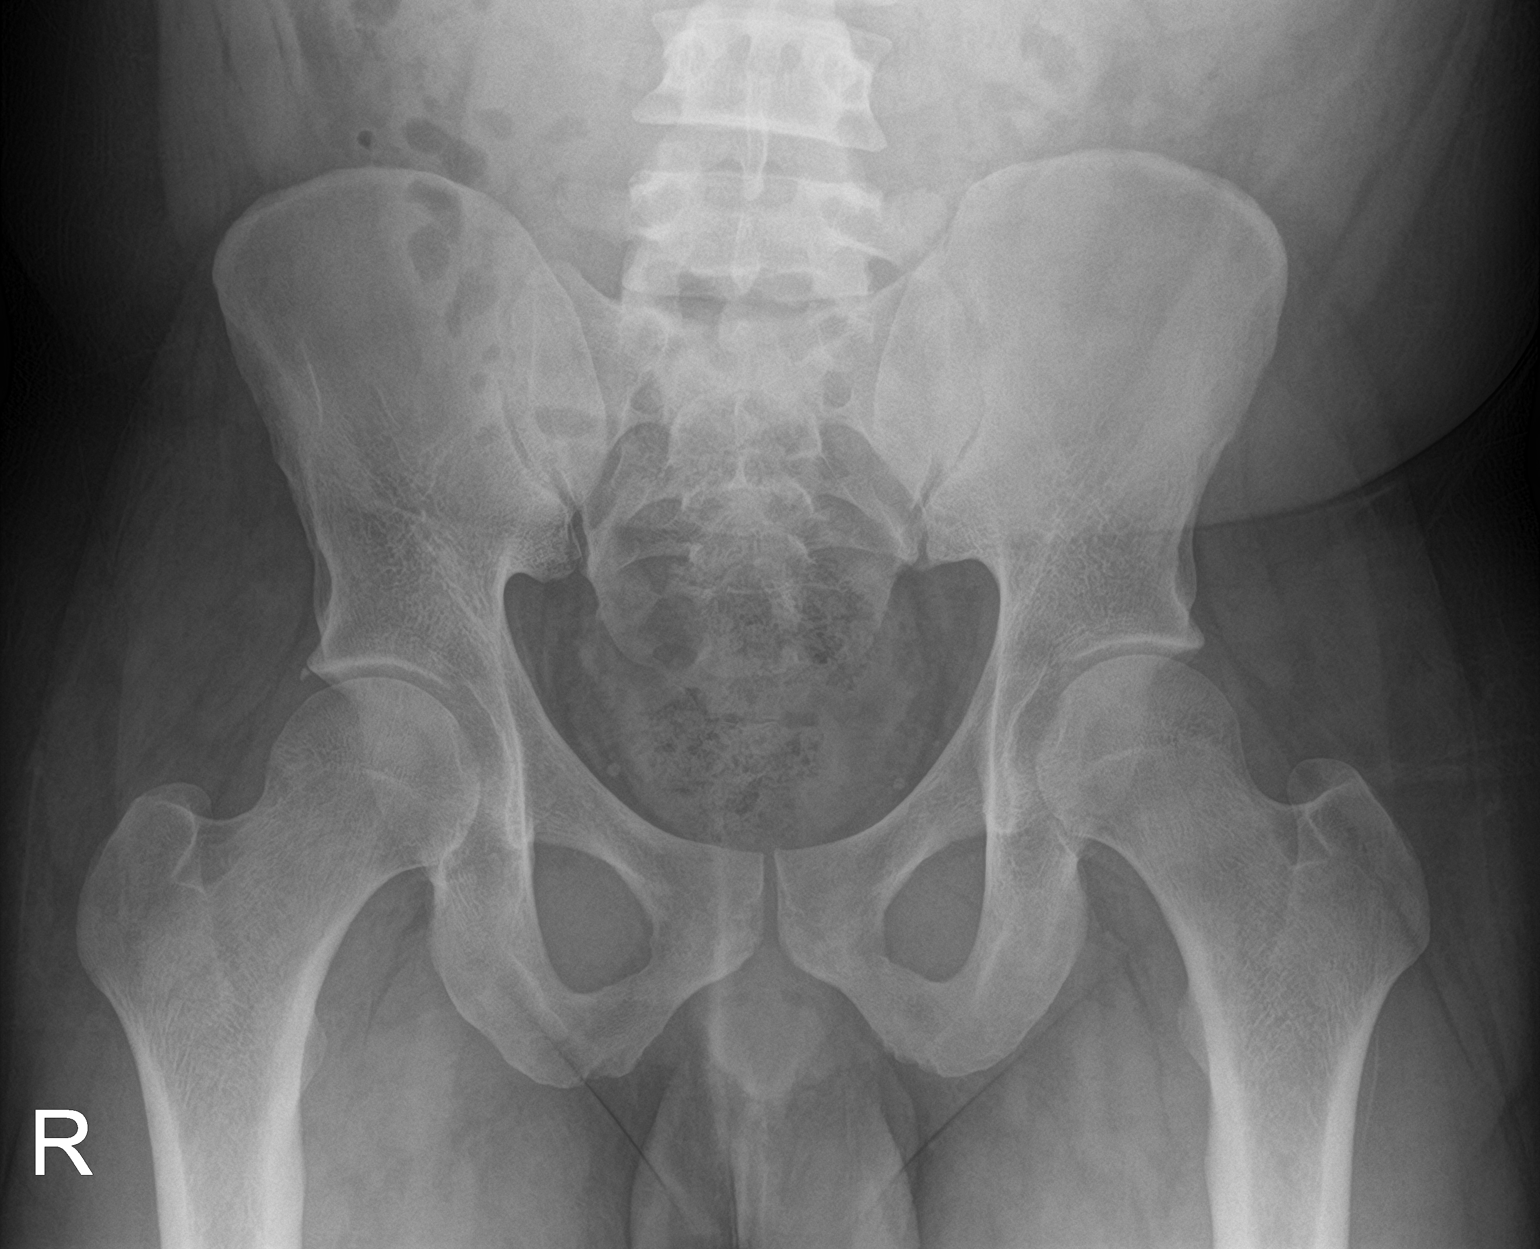

[1 of 1 positions shown; findings below may reference images not displayed]

FINDINGS: There is no evidence of pelvic fracture or diastasis. No pelvic bone
lesions are seen.
IMPRESSION: Negative.

## 2020-11-19 ENCOUNTER — Ambulatory Visit (HOSPITAL_COMMUNITY)
Admission: EM | Admit: 2020-11-19 | Discharge: 2020-11-19 | Disposition: A | Discharge status: Home / Self Care | Payer: Self-pay | Attending: Internal Medicine | Admitting: Internal Medicine

## 2020-11-19 ENCOUNTER — Encounter (HOSPITAL_COMMUNITY): Payer: Self-pay | Admitting: Emergency Medicine

## 2020-11-19 ENCOUNTER — Other Ambulatory Visit: Payer: Self-pay

## 2020-11-19 DIAGNOSIS — Z8616 Personal history of COVID-19: Secondary | ICD-10-CM

## 2020-11-19 DIAGNOSIS — F1290 Cannabis use, unspecified, uncomplicated: Secondary | ICD-10-CM

## 2020-11-19 DIAGNOSIS — R0789 Other chest pain: Secondary | ICD-10-CM

## 2020-11-19 LAB — SARS CORONAVIRUS 2 (TAT 6-24 HRS): SARS Coronavirus 2: NEGATIVE

## 2020-11-19 MED ORDER — FAMOTIDINE 20 MG PO TABS: 20.0000 mg | ORAL_TABLET | Freq: Two times a day (BID) | ORAL | 0 refills | Status: AC

## 2020-11-19 NOTE — ED Notes (Signed)
covid swab in lab/labeled at bedside

## 2020-11-19 NOTE — ED Provider Notes (Signed)
[facility]   MRN: [id number] DOB: [birth year]  Subjective:   Johnathan Roy is a 40 y.o. male presenting for 3-day history of acute onset persistent moderate to severe midsternal chest pain.  Patient states that it is mostly constant, is not worsening but is not being relieved.  He did have an episode of vomiting, headaches and cold chills, took a COVID-19 test at home and was negative.  Has a history of COVID-19 in December.  Has both vaccines and but no booster.  Denies fever, active headache, runny or stuffy nose, sore throat, cough, active shortness of breath, active nausea or vomiting, body aches.  Smokes marijuana 1-2 times daily.  No alcohol use.  He is a former smoker.  Has a history of hypertension and acid reflux.  He is not currently taking medications for his blood pressure as his PCP weaned him off of this medication.  No family history of arrhythmias, heart disease.  No history of PE, recent hospitalizations.  No current facility-administered medications for this encounter.  Current Outpatient Medications:    albuterol (VENTOLIN HFA) 108 (90 Base) MCG/ACT inhaler, Inhale 1-2 puffs into the lungs every 6 (six) hours as needed for wheezing or shortness of breath., Disp: 1 each, Rfl: 0   No Known Allergies  Past Medical History:  Diagnosis Date   GERD (gastroesophageal reflux disease)    Hypertension      History reviewed. No pertinent surgical history.  Family History  Problem Relation Age of Onset   Cancer Mother    Cancer Father     Social History   Tobacco Use   Smoking status: Never   Smokeless tobacco: Never  Vaping Use   Vaping Use: Never used  Substance Use Topics   Alcohol use: No   Drug use: Yes    Types: Marijuana    Comment: twice daily    ROS   Objective:   Vitals: BP (!) 149/91 (BP Location: Right Arm)   Pulse 78   Temp 98.5 F (36.9 C) (Oral)   Resp 20   SpO2 97%   Physical Exam Constitutional:      General: He  is not in acute distress.    Appearance: Normal appearance. He is well-developed. He is not ill-appearing, toxic-appearing or diaphoretic.  HENT:     Head: Normocephalic and atraumatic.     Right Ear: External ear normal.     Left Ear: External ear normal.     Nose: Nose normal.     Mouth/Throat:     Mouth: Mucous membranes are moist.     Pharynx: Oropharynx is clear.  Eyes:     General: No scleral icterus.       Right eye: No discharge.        Left eye: No discharge.     Extraocular Movements: Extraocular movements intact.     Conjunctiva/sclera: Conjunctivae normal.     Pupils: Pupils are equal, round, and reactive to light.  Cardiovascular:     Rate and Rhythm: Normal rate and regular rhythm.     Heart sounds: Normal heart sounds. No murmur heard.   No friction rub. No gallop.  Pulmonary:     Effort: Pulmonary effort is normal. No respiratory distress.     Breath sounds: Normal breath sounds. No stridor. No wheezing, rhonchi or rales.  Skin:    General: Skin is warm and dry.  Neurological:     Mental Status: He is alert and oriented to person,  place, and time.  Psychiatric:        Mood and Affect: Mood normal.        Behavior: Behavior normal.        Thought Content: Thought content normal.        Judgment: Judgment normal.    ED ECG REPORT   Date: 11/19/2020  Rate: 81bpm  Rhythm: normal sinus rhythm  QRS Axis: normal  Intervals: normal  ST/T Wave abnormalities: nonspecific T wave changes  Conduction Disutrbances:none  Narrative Interpretation: Sinus rhythm at 81 bpm with nonspecific T wave inversion in lead III but otherwise very comparable to previous EKG.  Old EKG Reviewed: unchanged  I have personally reviewed the EKG tracing and agree with the computerized printout as noted.   Assessment and Plan :   PDMP not reviewed this encounter.  1. Atypical chest pain   2. Marijuana use   3. History of COVID-19     Discussed differential with patient and he has  low risk factors for having a pulmonary embolism, ACS.  COVID-19 testing is pending, recommended supportive cares.  Given his history of acid reflux we will also have him start famotidine and avoid acidic foods and beverages. Counseled patient on potential for adverse effects with medications prescribed/recommended today, ER and return-to-clinic precautions discussed, patient verbalized understanding.    Wallis Bamberg, [place] 11/19/20 [phone]

## 2020-11-19 NOTE — ED Triage Notes (Signed)
headache and chest pain since Monday.  Took a covid test yesterday and resulted negative.   Monday vomited all day.  No more vomiting since Monday.   Denies runny nose, but slight cough.   Patient describes extreme diaphoresis driving car yesterday.
